# Patient Record
Sex: Male | Born: 1980 | Marital: Married | Smoking: Never smoker
Health system: Southern US, Community
[De-identification: ages and names within clinical notes are randomized; demographics above are authoritative.]

---

## 2020-12-22 ENCOUNTER — Emergency Department (HOSPITAL_COMMUNITY): Payer: Self-pay | Admitting: Certified Registered Nurse Anesthetist

## 2020-12-22 ENCOUNTER — Encounter (HOSPITAL_COMMUNITY): Payer: Self-pay

## 2020-12-22 ENCOUNTER — Encounter (HOSPITAL_COMMUNITY)
Admission: EM | Disposition: A | Discharge status: Home / Self Care | Payer: Self-pay | Source: Home / Self Care | Attending: Emergency Medicine

## 2020-12-22 ENCOUNTER — Emergency Department (HOSPITAL_COMMUNITY): Payer: Self-pay

## 2020-12-22 ENCOUNTER — Ambulatory Visit (HOSPITAL_COMMUNITY)
Admission: EM | Admit: 2020-12-22 | Discharge: 2020-12-23 | Disposition: A | Discharge status: Home / Self Care | Payer: Self-pay | Attending: Emergency Medicine | Admitting: Emergency Medicine

## 2020-12-22 ENCOUNTER — Other Ambulatory Visit: Payer: Self-pay

## 2020-12-22 DIAGNOSIS — Z20822 Contact with and (suspected) exposure to covid-19: Secondary | ICD-10-CM | POA: Insufficient documentation

## 2020-12-22 DIAGNOSIS — S41111A Laceration without foreign body of right upper arm, initial encounter: Secondary | ICD-10-CM | POA: Insufficient documentation

## 2020-12-22 DIAGNOSIS — Z23 Encounter for immunization: Secondary | ICD-10-CM | POA: Insufficient documentation

## 2020-12-22 DIAGNOSIS — W11XXXA Fall on and from ladder, initial encounter: Secondary | ICD-10-CM | POA: Insufficient documentation

## 2020-12-22 DIAGNOSIS — S46221A Laceration of muscle, fascia and tendon of other parts of biceps, right arm, initial encounter: Secondary | ICD-10-CM

## 2020-12-22 HISTORY — PX: I & D EXTREMITY: SHX5045

## 2020-12-22 LAB — BASIC METABOLIC PANEL
Anion gap: 17 — ABNORMAL HIGH (ref 5–15)
BUN: 17 mg/dL (ref 6–20)
CO2: 18 mmol/L — ABNORMAL LOW (ref 22–32)
Calcium: 9.4 mg/dL (ref 8.9–10.3)
Chloride: 100 mmol/L (ref 98–111)
Creatinine, Ser: 1.52 mg/dL — ABNORMAL HIGH (ref 0.61–1.24)
GFR, Estimated: 59 mL/min — ABNORMAL LOW (ref >60)
Glucose, Bld: 115 mg/dL — ABNORMAL HIGH (ref 70–99)
Potassium: 4.2 mmol/L (ref 3.5–5.1)
Sodium: 135 mmol/L (ref 135–145)

## 2020-12-22 LAB — CBC WITH DIFFERENTIAL/PLATELET
Abs Immature Granulocytes: 0.12 10*3/uL — ABNORMAL HIGH (ref 0.00–0.07)
Basophils Absolute: 0.1 10*3/uL (ref 0.0–0.1)
Basophils Relative: 1 %
Eosinophils Absolute: 0.1 10*3/uL (ref 0.0–0.5)
Eosinophils Relative: 0 %
HCT: 55 % — ABNORMAL HIGH (ref 39.0–52.0)
Hemoglobin: 18.6 g/dL — ABNORMAL HIGH (ref 13.0–17.0)
Immature Granulocytes: 1 %
Lymphocytes Relative: 12 %
Lymphs Abs: 1.8 10*3/uL (ref 0.7–4.0)
MCH: 31.5 pg (ref 26.0–34.0)
MCHC: 33.8 g/dL (ref 30.0–36.0)
MCV: 93.1 fL (ref 80.0–100.0)
Monocytes Absolute: 0.9 10*3/uL (ref 0.1–1.0)
Monocytes Relative: 6 %
Neutro Abs: 11.8 10*3/uL — ABNORMAL HIGH (ref 1.7–7.7)
Neutrophils Relative %: 80 %
Platelets: 322 10*3/uL (ref 150–400)
RBC: 5.91 MIL/uL — ABNORMAL HIGH (ref 4.22–5.81)
RDW: 13.5 % (ref 11.5–15.5)
WBC: 14.7 10*3/uL — ABNORMAL HIGH (ref 4.0–10.5)
nRBC: 0 % (ref 0.0–0.2)

## 2020-12-22 LAB — RESP PANEL BY RT-PCR (FLU A&B, COVID) ARPGX2
Influenza A by PCR: NEGATIVE
Influenza B by PCR: NEGATIVE
SARS Coronavirus 2 by RT PCR: NEGATIVE

## 2020-12-22 SURGERY — IRRIGATION AND DEBRIDEMENT EXTREMITY
Anesthesia: General | Site: Arm Upper | Laterality: Right

## 2020-12-22 MED ORDER — SUCCINYLCHOLINE CHLORIDE 200 MG/10ML IV SOSY
PREFILLED_SYRINGE | INTRAVENOUS | Status: AC
  Filled 2020-12-22: qty 10

## 2020-12-22 MED ORDER — FENTANYL CITRATE (PF) 100 MCG/2ML IJ SOLN
50.0000 ug | Freq: Once | INTRAMUSCULAR | Status: AC
Start: 2020-12-22 — End: 2020-12-22
  Administered 2020-12-22: 50 ug via INTRAVENOUS
  Filled 2020-12-22: qty 2

## 2020-12-22 MED ORDER — LIDOCAINE HCL (PF) 2 % IJ SOLN
INTRAMUSCULAR | Status: AC
  Filled 2020-12-22: qty 5

## 2020-12-22 MED ORDER — BUPIVACAINE-EPINEPHRINE (PF) 0.25% -1:200000 IJ SOLN
INTRAMUSCULAR | Status: AC
  Filled 2020-12-22: qty 30

## 2020-12-22 MED ORDER — SUGAMMADEX SODIUM 200 MG/2ML IV SOLN
INTRAVENOUS | Status: DC | PRN
  Administered 2020-12-22: 200 mg via INTRAVENOUS

## 2020-12-22 MED ORDER — DEXAMETHASONE SODIUM PHOSPHATE 10 MG/ML IJ SOLN
INTRAMUSCULAR | Status: DC | PRN
  Administered 2020-12-22: 10 mg via INTRAVENOUS

## 2020-12-22 MED ORDER — TETANUS-DIPHTH-ACELL PERTUSSIS 5-2.5-18.5 LF-MCG/0.5 IM SUSY
0.5000 mL | PREFILLED_SYRINGE | Freq: Once | INTRAMUSCULAR | Status: AC
  Administered 2020-12-22: 0.5 mL via INTRAMUSCULAR
  Filled 2020-12-22: qty 0.5

## 2020-12-22 MED ORDER — DEXAMETHASONE SODIUM PHOSPHATE 10 MG/ML IJ SOLN
INTRAMUSCULAR | Status: AC
  Filled 2020-12-22: qty 1

## 2020-12-22 MED ORDER — EPHEDRINE 5 MG/ML INJ
INTRAVENOUS | Status: AC
  Filled 2020-12-22: qty 10

## 2020-12-22 MED ORDER — HYDROMORPHONE HCL 1 MG/ML IJ SOLN: 0.2500 mg | INTRAMUSCULAR | Status: DC | PRN

## 2020-12-22 MED ORDER — ONDANSETRON HCL 4 MG/2ML IJ SOLN
4.0000 mg | Freq: Once | INTRAMUSCULAR | Status: AC
  Administered 2020-12-22: 4 mg via INTRAVENOUS
  Filled 2020-12-22: qty 2

## 2020-12-22 MED ORDER — 0.9 % SODIUM CHLORIDE (POUR BTL) OPTIME
TOPICAL | Status: DC | PRN
  Administered 2020-12-22: 6000 mL
  Administered 2020-12-22: 1000 mL

## 2020-12-22 MED ORDER — PROPOFOL 10 MG/ML IV BOLUS
INTRAVENOUS | Status: DC | PRN
  Administered 2020-12-22: 200 mg via INTRAVENOUS

## 2020-12-22 MED ORDER — SODIUM CHLORIDE 0.9 % IV BOLUS
1000.0000 mL | Freq: Once | INTRAVENOUS | Status: AC
  Administered 2020-12-22: 1000 mL via INTRAVENOUS

## 2020-12-22 MED ORDER — LACTATED RINGERS IV SOLN: INTRAVENOUS | Status: DC | PRN

## 2020-12-22 MED ORDER — PHENYLEPHRINE 40 MCG/ML (10ML) SYRINGE FOR IV PUSH (FOR BLOOD PRESSURE SUPPORT)
PREFILLED_SYRINGE | INTRAVENOUS | Status: AC
  Filled 2020-12-22: qty 10

## 2020-12-22 MED ORDER — PROPOFOL 10 MG/ML IV BOLUS
INTRAVENOUS | Status: AC
  Filled 2020-12-22: qty 20

## 2020-12-22 MED ORDER — VANCOMYCIN HCL 1000 MG IV SOLR: INTRAVENOUS | Status: DC | PRN

## 2020-12-22 MED ORDER — OXYCODONE-ACETAMINOPHEN 5-325 MG PO TABS: 1.0000 | ORAL_TABLET | ORAL | 0 refills | Status: AC | PRN

## 2020-12-22 MED ORDER — EPHEDRINE SULFATE-NACL 50-0.9 MG/10ML-% IV SOSY
PREFILLED_SYRINGE | INTRAVENOUS | Status: DC | PRN
  Administered 2020-12-22: 5 mg via INTRAVENOUS

## 2020-12-22 MED ORDER — CEFAZOLIN SODIUM-DEXTROSE 2-4 GM/100ML-% IV SOLN
2.0000 g | Freq: Once | INTRAVENOUS | Status: AC
  Administered 2020-12-22: 2 g via INTRAVENOUS
  Filled 2020-12-22: qty 100

## 2020-12-22 MED ORDER — PHENYLEPHRINE 40 MCG/ML (10ML) SYRINGE FOR IV PUSH (FOR BLOOD PRESSURE SUPPORT)
PREFILLED_SYRINGE | INTRAVENOUS | Status: DC | PRN
  Administered 2020-12-22: 120 ug via INTRAVENOUS
  Administered 2020-12-22: 160 ug via INTRAVENOUS
  Administered 2020-12-22: 120 ug via INTRAVENOUS

## 2020-12-22 MED ORDER — FENTANYL CITRATE (PF) 250 MCG/5ML IJ SOLN
INTRAMUSCULAR | Status: AC
  Filled 2020-12-22: qty 5

## 2020-12-22 MED ORDER — ROCURONIUM BROMIDE 10 MG/ML (PF) SYRINGE
PREFILLED_SYRINGE | INTRAVENOUS | Status: DC | PRN
  Administered 2020-12-22: 40 mg via INTRAVENOUS

## 2020-12-22 MED ORDER — LIDOCAINE 2% (20 MG/ML) 5 ML SYRINGE
INTRAMUSCULAR | Status: DC | PRN
  Administered 2020-12-22: 60 mg via INTRAVENOUS

## 2020-12-22 MED ORDER — SUCCINYLCHOLINE CHLORIDE 200 MG/10ML IV SOSY
PREFILLED_SYRINGE | INTRAVENOUS | Status: DC | PRN
  Administered 2020-12-22: 100 mg via INTRAVENOUS

## 2020-12-22 MED ORDER — ARTIFICIAL TEARS OPHTHALMIC OINT
TOPICAL_OINTMENT | OPHTHALMIC | Status: AC
  Filled 2020-12-22: qty 3.5

## 2020-12-22 MED ORDER — MIDAZOLAM HCL 2 MG/2ML IJ SOLN
INTRAMUSCULAR | Status: DC | PRN
  Administered 2020-12-22: 2 mg via INTRAVENOUS

## 2020-12-22 MED ORDER — ONDANSETRON HCL 4 MG/2ML IJ SOLN
INTRAMUSCULAR | Status: AC
  Filled 2020-12-22: qty 2

## 2020-12-22 MED ORDER — FENTANYL CITRATE (PF) 250 MCG/5ML IJ SOLN
INTRAMUSCULAR | Status: DC | PRN
  Administered 2020-12-22 (×2): 100 ug via INTRAVENOUS

## 2020-12-22 MED ORDER — ONDANSETRON HCL 4 MG/2ML IJ SOLN
INTRAMUSCULAR | Status: DC | PRN
  Administered 2020-12-22: 4 mg via INTRAVENOUS

## 2020-12-22 MED ORDER — ROCURONIUM BROMIDE 10 MG/ML (PF) SYRINGE
PREFILLED_SYRINGE | INTRAVENOUS | Status: AC
  Filled 2020-12-22: qty 10

## 2020-12-22 MED ORDER — VANCOMYCIN HCL 1000 MG IV SOLR
INTRAVENOUS | Status: AC
  Filled 2020-12-22: qty 1000

## 2020-12-22 MED ORDER — MIDAZOLAM HCL 2 MG/2ML IJ SOLN
INTRAMUSCULAR | Status: AC
  Filled 2020-12-22: qty 2

## 2020-12-22 MED ORDER — HYDROMORPHONE HCL 1 MG/ML IJ SOLN
1.0000 mg | Freq: Once | INTRAMUSCULAR | Status: AC
Start: 2020-12-22 — End: 2020-12-22
  Administered 2020-12-22: 1 mg via INTRAVENOUS
  Filled 2020-12-22: qty 1

## 2020-12-22 SURGICAL SUPPLY — 42 items
BNDG ELASTIC 3X5.8 VLCR STR LF (GAUZE/BANDAGES/DRESSINGS) ×2 IMPLANT
BNDG ELASTIC 4X5.8 VLCR STR LF (GAUZE/BANDAGES/DRESSINGS) ×2 IMPLANT
BNDG GAUZE ELAST 4 BULKY (GAUZE/BANDAGES/DRESSINGS) ×4 IMPLANT
BRUSH SCRUB EZ PLAIN DRY (MISCELLANEOUS) ×4 IMPLANT
COVER SURGICAL LIGHT HANDLE (MISCELLANEOUS) ×6 IMPLANT
DRAPE HALF SHEET 40X57 (DRAPES) ×2 IMPLANT
DRAPE SURG 17X23 STRL (DRAPES) ×3 IMPLANT
DRAPE U-SHAPE 47X51 STRL (DRAPES) ×3 IMPLANT
DRSG MEPITEL 4X7.2 (GAUZE/BANDAGES/DRESSINGS) ×2 IMPLANT
ELECT REM PT RETURN 9FT ADLT (ELECTROSURGICAL) ×3
ELECTRODE REM PT RTRN 9FT ADLT (ELECTROSURGICAL) IMPLANT
GAUZE SPONGE 4X4 12PLY STRL (GAUZE/BANDAGES/DRESSINGS) ×3 IMPLANT
GAUZE SPONGE 4X4 12PLY STRL LF (GAUZE/BANDAGES/DRESSINGS) ×2 IMPLANT
GLOVE BIO SURGEON STRL SZ 6.5 (GLOVE) ×6 IMPLANT
GLOVE BIO SURGEON STRL SZ7.5 (GLOVE) ×10 IMPLANT
GLOVE BIO SURGEONS STRL SZ 6.5 (GLOVE) ×3
GLOVE BIOGEL PI IND STRL 7.5 (GLOVE) ×1 IMPLANT
GLOVE BIOGEL PI INDICATOR 7.5 (GLOVE) ×2
GLOVE SURG UNDER POLY LF SZ6.5 (GLOVE) ×3 IMPLANT
GOWN STRL REUS W/ TWL LRG LVL3 (GOWN DISPOSABLE) ×2 IMPLANT
GOWN STRL REUS W/TWL LRG LVL3 (GOWN DISPOSABLE) ×6
KIT BASIN OR (CUSTOM PROCEDURE TRAY) ×3 IMPLANT
KIT TURNOVER KIT B (KITS) ×3 IMPLANT
MANIFOLD NEPTUNE II (INSTRUMENTS) ×3 IMPLANT
NS IRRIG 1000ML POUR BTL (IV SOLUTION) ×3 IMPLANT
PACK ORTHO EXTREMITY (CUSTOM PROCEDURE TRAY) ×3 IMPLANT
PAD ARMBOARD 7.5X6 YLW CONV (MISCELLANEOUS) ×6 IMPLANT
PAD CAST 4YDX4 CTTN HI CHSV (CAST SUPPLIES) IMPLANT
PADDING CAST COTTON 4X4 STRL (CAST SUPPLIES) ×3
SET CYSTO W/LG BORE CLAMP LF (SET/KITS/TRAYS/PACK) ×2 IMPLANT
SLING ARM IMMOBILIZER LRG (SOFTGOODS) ×2 IMPLANT
SOL PREP POV-IOD 4OZ 10% (MISCELLANEOUS) ×2 IMPLANT
SOL PREP PROV IODINE SCRUB 4OZ (MISCELLANEOUS) ×2 IMPLANT
SPONGE LAP 18X18 RF (DISPOSABLE) ×3 IMPLANT
SUT ETHILON 3 0 FSL (SUTURE) ×4 IMPLANT
SUT ETHILON 3 0 PS 1 (SUTURE) ×6 IMPLANT
TOWEL GREEN STERILE (TOWEL DISPOSABLE) ×4 IMPLANT
TOWEL GREEN STERILE FF (TOWEL DISPOSABLE) ×3 IMPLANT
TUBE CONNECTING 12'X1/4 (SUCTIONS) ×1
TUBE CONNECTING 12X1/4 (SUCTIONS) ×2 IMPLANT
UNDERPAD 30X36 HEAVY ABSORB (UNDERPADS AND DIAPERS) ×3 IMPLANT
YANKAUER SUCT BULB TIP NO VENT (SUCTIONS) ×3 IMPLANT

## 2020-12-22 NOTE — Op Note (Signed)
Orthopaedic Surgery Operative Note (CSN: [id number] ) Date of Surgery: 12/22/2020  Admit Date: 12/22/2020   Diagnoses: Pre-Op Diagnoses: Right upper arm laceration   Post-Op Diagnosis: Right upper arm laceration Right biceps muscle laceration  Procedures: 1. CPT 24341-Primary repair of biceps muscle belly/fascia 2. CPT 11043-Debridement of muscle and fascia (<20 sq cm) 3. CPT 12005-Repair laceration of right upper arm (13 cm in length)   Surgeons : Primary: Roby Lofts, MD  Assistant: None  Location:OR 7   Anesthesia:General   Antibiotics: Ancef 2g in ER   Tourniquet time:None  Estimated Blood Loss:5 mL  Complications:None  Specimens:None   Implants: * No implants in log *   Indications for Surgery: 40 year old male who fell injuring his arm had an paling injury a large laceration that extended into the biceps muscle.  Due to the significant nature of his injury I recommended proceeding to the operating room for irrigation debridement and repair as necessary.  Risks and benefits were discussed with the patient.  Risks include but not limited to bleeding, infection, nerve or blood vessel injury, elbow stiffness, arm weakness, possibility of inability to repair muscle or tendon, even the possibility anesthetic complications.  The patient agreed to proceed with surgery and consent was obtained.  Operative Findings: 1.  13 cm laceration to the upper arm that transverse the majority of the biceps muscle belly.  There was still some retained lateral portion of the long head of the biceps. 2.  Irrigation debridement of skin subcutaneous tissue, fascia and biceps muscle belly with out any gross contamination. 3.  Attempt at primary repair of biceps with suturing of the muscle belly as well as the overlying biceps fascia to approximate the muscle edges.  Procedure: The patient was identified in the preoperative holding area. Consent was confirmed with the patient and their  family and all questions were answered. The operative extremity was marked after confirmation with the patient. he was then brought back to the operating room by our anesthesia colleagues.  He was placed under general anesthetic and carefully transferred over to a regular OR table.  His right upper extremity was prepped and draped in usual sterile fashion.  A timeout was performed to verify the patient, the procedure, and the extremity.  He had already received preoperative antibiotics in the emergency room.  For started out by extending the laceration to expose the entirety of the wound and the biceps muscle belly and fascia.  The laceration had transected at the muscle belly.  I was able to palpate the median nerve and the brachial artery that were intact without any violation.  There is a portion of the biceps muscle that was still intact laterally approximately 25% of the muscle belly itself.  The brachialis was still intact below the laceration.  I used a knife to debride the skin edges the subcutaneous tissue, fascia and some of the muscle.  There is no gross contamination.  I then used 6 L of normal saline using cystoscopy tubing to irrigate the wound.  I then changed gloves and instruments.  I then used a 2-0 Monocryl to suture the muscle belly together.  I also brought the incised biceps fascia together.  This eliminated some of the deformity that was present preoperatively.  I continue to close the biceps fascia with a figure-of-eight sutures using the 2-0 Monocryl.  I then proceeded to run the 2-0 Monocryl to close the skin and subcutaneous fashion.  A 3-0 nylon was used to reinforce the  skin repair.  I then injected quarter percent Marcaine with epinephrine locally into the soft tissues to provide pain control.  A sterile dressing consisting of Mepitel, 4 x 4's, sterile cast padding and Ace wrap was applied.  A sling was applied to his upper extremity.  The patient was then awoken from anesthesia and  taken to the PACU in stable condition.   Debridement type: Excisional Debridement  Side: right  Body Location: Upper arm  Tools used for debridement: scalpel and scissors  Pre-debridement Wound size (cm):   Length: 13cm        Width: 4cm     Depth: 4cm   Post-debridement Wound size (cm):  N/A  Debridement depth beyond dead/damaged tissue down to healthy viable tissue: yes  Tissue layer involved: skin, subcutaneous tissue, muscle / fascia  Nature of tissue removed: Non-viable tissue  Irrigation volume: 6L     Irrigation fluid type: Normal Saline   Post Op Plan/Instructions: Patient will be nonweightbearing to the right upper extremity.  He will keep his dressing on for 48 hours.  Then he will remove the dressing.  He will allow gentle range of motion of the elbow as tolerated.  Return for suture removal and wound check in 7 to 10 days.  No DVT prophylaxis is needed in this healthy upper extremity patient.  I was present and performed the entire surgery.  Truitt Merle, MD [facility]

## 2020-12-22 NOTE — Discharge Instructions (Signed)
Orthopaedic Trauma Service Discharge Instructions   General Discharge Instructions  WEIGHT BEARING STATUS: Non-weightbearing right upper extremity  RANGE OF MOTION/ACTIVITY: Wear sling for next 1 week.  Wound Care: You may remove your surgical dressing on Saturday 12/25/20. Incisions can be left open to air if there is no drainage. If incision continues to have drainage, follow wound care instructions below. Okay to shower if no drainage from incisions.  DVT/PE prophylaxis: None  Diet: as you were eating previously.  Can use over the counter stool softeners and bowel preparations, such as Miralax, to help with bowel movements.  Narcotics can be constipating.  Be sure to drink plenty of fluids  PAIN MEDICATION USE AND EXPECTATIONS  You have likely been given narcotic medications to help control your pain.  After a traumatic event that results in an fracture (broken bone) with or without surgery, it is ok to use narcotic pain medications to help control one's pain.  We understand that everyone responds to pain differently and each individual patient will be evaluated on a regular basis for the continued need for narcotic medications.  As a patient it is your responsibility as well to monitor narcotic medication use and report the amount and frequency you use these medications when you come to your office visit.   We would also advise that if you are using narcotic medications, you should take a dose prior to therapy to maximize you participation.  IF YOU ARE ON NARCOTIC MEDICATIONS IT IS NOT PERMISSIBLE TO OPERATE A MOTOR VEHICLE (MOTORCYCLE/CAR/TRUCK/MOPED) OR HEAVY MACHINERY DO NOT MIX NARCOTICS WITH OTHER CNS (CENTRAL NERVOUS SYSTEM) DEPRESSANTS SUCH AS ALCOHOL   STOP SMOKING OR USING NICOTINE PRODUCTS!!!!  As discussed nicotine severely impairs your body's ability to heal surgical and traumatic wounds but also impairs bone healing.  Wounds and bone heal by forming microscopic blood  vessels (angiogenesis) and nicotine is a vasoconstrictor (essentially, shrinks blood vessels).  Therefore, if vasoconstriction occurs to these microscopic blood vessels they essentially disappear and are unable to deliver necessary nutrients to the healing tissue.  This is one modifiable factor that you can do to dramatically increase your chances of healing your injury.    (This means no smoking, no nicotine gum, patches, etc)  DO NOT USE NONSTEROIDAL ANTI-INFLAMMATORY DRUGS (NSAID'S)  Using products such as Advil (ibuprofen), Aleve (naproxen), Motrin (ibuprofen) for additional pain control during fracture healing can delay and/or prevent the healing response.  If you would like to take over the counter (OTC) medication, Tylenol (acetaminophen) is ok.  However, some narcotic medications that are given for pain control contain acetaminophen as well. Therefore, you should not exceed more than 4000 mg of tylenol in a day if you do not have liver disease.  Also note that there are may OTC medicines, such as cold medicines and allergy medicines that my contain tylenol as well.  If you have any questions about medications and/or interactions please ask your doctor/PA or your pharmacist.      ICE AND ELEVATE INJURED/OPERATIVE EXTREMITY  Using ice and elevating the injured extremity above your heart can help with swelling and pain control.  Icing in a pulsatile fashion, such as 20 minutes on and 20 minutes off, can be followed.    Do not place ice directly on skin. Make sure there is a barrier between to skin and the ice pack.    Using frozen items such as frozen peas works well as the conform nicely to the are that needs to  be iced.  USE AN ACE WRAP OR TED HOSE FOR SWELLING CONTROL  In addition to icing and elevation, Ace wraps or TED hose are used to help limit and resolve swelling.  It is recommended to use Ace wraps or TED hose until you are informed to stop.    When using Ace Wraps start the wrapping  distally (farthest away from the body) and wrap proximally (closer to the body)   Example: If you had surgery on your leg or thing and you do not have a splint on, start the ace wrap at the toes and work your way up to the thigh        If you had surgery on your upper extremity and do not have a splint on, start the ace wrap at your fingers and work your way up to the upper arm   CALL THE OFFICE WITH ANY QUESTIONS/CONCERNS OR FOR MEDICATION REFILLS: [phone]   VISIT OUR WEBSITE FOR ADDITIONAL INFORMATION: [email or web address]     Discharge Wound Care Instructions  Do NOT apply any ointments, solutions or lotions to pin sites or surgical wounds.  These prevent needed drainage and even though solutions like hydrogen peroxide kill bacteria, they also damage cells lining the pin sites that help fight infection.  Applying lotions or ointments can keep the wounds moist and can cause them to breakdown and open up as well. This can increase the risk for infection. When in doubt call the office.  If any drainage is noted, use one layer of adaptic, then gauze, Kerlix, and an ace wrap.  Once the incision is completely dry and without drainage, it may be left open to air out.  Showering may begin 36-48 hours later.  Cleaning gently with soap and water.  Traumatic wounds should be dressed daily as well.    One layer of adaptic, gauze, Kerlix, then ace wrap.  The adaptic can be discontinued once the draining has ceased    If you have a wet to dry dressing: wet the gauze with saline the squeeze as much saline out so the gauze is moist (not soaking wet), place moistened gauze over wound, then place a dry gauze over the moist one, followed by Kerlix wrap, then ace wrap.

## 2020-12-22 NOTE — ED Notes (Signed)
Pt stated his arm was bleeding more. I examined the area I noticed a slow bleed. I will notify provider.

## 2020-12-22 NOTE — ED Notes (Signed)
Pt speaking to cousin on the phone. Pt requested to add her to his chart to discuss care. Pt care team added her.

## 2020-12-22 NOTE — Anesthesia Procedure Notes (Signed)
Procedure Name: Intubation Date/Time: 12/22/2020 9:31 PM Performed by: Genevie Ann, CRNA Pre-anesthesia Checklist: Patient identified, Emergency Drugs available, Suction available and Patient being monitored Patient Re-evaluated:Patient Re-evaluated prior to induction Oxygen Delivery Method: Circle System Utilized Preoxygenation: Pre-oxygenation with 100% oxygen Induction Type: IV induction Ventilation: Mask ventilation without difficulty Laryngoscope Size: Glidescope and 4 Grade View: Grade I Tube type: Oral Tube size: 7.5 mm Number of attempts: 3 Airway Equipment and Method: Stylet and Oral airway Placement Confirmation: ETT inserted through vocal cords under direct vision,  positive ETCO2 and breath sounds checked- equal and bilateral Secured at: 21 cm Tube secured with: Tape Dental Injury: Teeth and Oropharynx as per pre-operative assessment  Difficulty Due To: Difficulty was unanticipated Comments: 3 combined DVL attempts by CRNA and MD with Mac 4 & Miller 3, 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

## 2020-12-22 NOTE — ED Notes (Signed)
Pt pocket knife was labeled with pt stickers and provided to security. OR notified pt pocket knife is with security.

## 2020-12-22 NOTE — Anesthesia Postprocedure Evaluation (Signed)
Anesthesia Post Note  Patient: Antoine Primas  Procedure(s) Performed: IRRIGATION AND DEBRIDEMENT RIGHT UPPER ARM (Right Arm Upper)     Patient location during evaluation: PACU Anesthesia Type: General Level of consciousness: awake and alert Pain management: pain level controlled Vital Signs Assessment: post-procedure vital signs reviewed and stable Respiratory status: spontaneous breathing, nonlabored ventilation and respiratory function stable Cardiovascular status: blood pressure returned to baseline and stable Postop Assessment: no apparent nausea or vomiting Anesthetic complications: no   No complications documented.  Last Vitals:  Vitals:   12/22/20 2315 12/22/20 2330  BP: (!) 144/97 (!) 144/93  Pulse: 91 92  Resp: (!) 21 13  Temp:  36.8 C  SpO2: 95% 98%    Last Pain:  Vitals:   12/22/20 2245  TempSrc:   PainSc: 0-No pain                 Marrianne Sica,W. EDMOND

## 2020-12-22 NOTE — H&P (Signed)
[facility] (OTS) Consult   Patient ID: Jimmy Gilmore MRN: [id number] DOB/AGE: Aug 25, 1980 40 y.o.  Reason for Consult:Right arm laceration Referring Physician: Dr. Norman Clay, MD [facility]  HPI: Jimmy Gilmore is an 40 y.o. male who is being seen in consultation at the request of Dr. Audley Hose for evaluation of right upper arm laceration.  The patient was on a ladder got twisted up had his arm impaled as he came down had a large laceration with significant involvement of the biceps tendon he states.  Had a significant bleeding presented to the emergency room where x-rays showed no acute fracture.  Orthopedics was consulted for evaluation and treatment.  Patient was seen and evaluated.  He is left-hand dominant.  Denies any tobacco use.  Lives at home with his cousin.  Works as a Administrator and other physical activity.  He denies any numbness or tingling in his upper extremity.  Denies any other injuries.  History reviewed. No pertinent past medical history.  History reviewed. No pertinent surgical history.  No family history on file.  Social History:  reports that he has never smoked. He has never used smokeless tobacco. No history on file for alcohol use and drug use.  Allergies: Not on File  Medications:  No current facility-administered medications on file prior to encounter.   No current outpatient medications on file prior to encounter.    ROS: Constitutional: No fever or chills Vision: No changes in vision ENT: No difficulty swallowing CV: No chest pain Pulm: No SOB or wheezing GI: No nausea or vomiting GU: No urgency or inability to hold urine Skin: No poor wound healing Neurologic: No numbness or tingling Psychiatric: No depression or anxiety Heme: No bruising Allergic: No reaction to medications or food   Exam: Blood pressure (!) 135/105, pulse 88, temperature 98.9 F (37.2 C), temperature source Oral, resp. rate (!) 22, height 5\' 6"  (1.676 m), weight  74.8 kg, SpO2 100 %. General: No acute distress Orientation: Awake alert and oriented x3 Mood and Affect: Cooperative and pleasant Gait: Within normal limits Coordination and balance: Within normal limits  Right upper extremity: Dressing is in place.  I did not take this down as I visualized the wounds from the previous pictures that were taken by the emergency room providers.  The compartments are soft compressible.  He does appear to have a slight deformity to his upper arm in the biceps region.  He has intact motor and sensory function to median, radial and ulnar nerve distribution.  2+ radial pulses.  He has brisk cap refill of less than 2 seconds.  He has no pain with wrist or finger range of motion.  Does have pain with attempted elbow range of motion.  Left upper extremity skin without lesions. No tenderness to palpation. Full painless ROM, full strength in each muscle groups without evidence of instability.   Medical Decision Making: Data: Imaging: X-rays of the right arm are reviewed which shows no acute fracture dislocation  Labs:  Results for orders placed or performed during the hospital encounter of 12/22/20 (from the past 24 hour(s))  Basic metabolic panel     Status: Abnormal   Collection Time: 12/22/20  6:12 PM  Result Value Ref Range   Sodium 135 135 - 145 mmol/L   Potassium 4.2 3.5 - 5.1 mmol/L   Chloride 100 98 - 111 mmol/L   CO2 18 (L) 22 - 32 mmol/L   Glucose, Bld 115 (H) 70 - 99  mg/dL   BUN 17 6 - 20 mg/dL   Creatinine, Ser 3.97 (H) 0.61 - 1.24 mg/dL   Calcium 9.4 8.9 - 67.3 mg/dL   GFR, Estimated 59 (L) >60 mL/min   Anion gap 17 (H) 5 - 15  CBC with Differential     Status: Abnormal   Collection Time: 12/22/20  6:12 PM  Result Value Ref Range   WBC 14.7 (H) 4.0 - 10.5 K/uL   RBC 5.91 (H) 4.22 - 5.81 MIL/uL   Hemoglobin 18.6 (H) 13.0 - 17.0 g/dL   HCT 41.9 (H) 37.9 - 02.4 %   MCV 93.1 80.0 - 100.0 fL   MCH 31.5 26.0 - 34.0 pg   MCHC 33.8 30.0 - 36.0 g/dL    RDW 09.7 35.3 - 29.9 %   Platelets 322 150 - 400 K/uL   nRBC 0.0 0.0 - 0.2 %   Neutrophils Relative % 80 %   Neutro Abs 11.8 (H) 1.7 - 7.7 K/uL   Lymphocytes Relative 12 %   Lymphs Abs 1.8 0.7 - 4.0 K/uL   Monocytes Relative 6 %   Monocytes Absolute 0.9 0.1 - 1.0 K/uL   Eosinophils Relative 0 %   Eosinophils Absolute 0.1 0.0 - 0.5 K/uL   Basophils Relative 1 %   Basophils Absolute 0.1 0.0 - 0.1 K/uL   Immature Granulocytes 1 %   Abs Immature Granulocytes 0.12 (H) 0.00 - 0.07 K/uL  Resp Panel by RT-PCR (Flu A&B, Covid) Nasopharyngeal Swab     Status: None   Collection Time: 12/22/20  6:12 PM   Specimen: Nasopharyngeal Swab; Nasopharyngeal(NP) swabs in vial transport medium  Result Value Ref Range   SARS Coronavirus 2 by RT PCR NEGATIVE NEGATIVE   Influenza A by PCR NEGATIVE NEGATIVE   Influenza B by PCR NEGATIVE NEGATIVE    Imaging or Labs ordered: None  Medical history and chart was reviewed and case discussed with medical provider.  Assessment/Plan: 40 year old male with a right upper arm laceration with likely biceps injury.  Due to the extent of his wound I recommend proceeding to the operating room urgently for irrigation debridement with possible repair versus possible closure.  The risks and benefits were discussed with the patient.  I discussed with him the possibility of inability to repair the biceps.  Dysfunction into rooms of elbow flexion.  I also discussed with him the possibility of nerve or blood vessel injury.  The patient agrees to proceed with surgery and consent was obtained.  We will likely have the patient discharged postoperatively.  Roby Lofts, MD [facility] [phone] (office) [email or web address]

## 2020-12-22 NOTE — ED Triage Notes (Signed)
Pt fell from a 40ft ladder. The fall was broken by a boat that was near the ladder. The pt hit his arm on the edge of the boat. There is approximately a 3.5" laceration on pt side bicep. The bleeding is controlled. The pt has a 5" bruise on right side of back. Pt states 0/10 pain for back and 5/10 pain for arm. Pt was given a total of 150 mcg fentanyl via ems. Pt stated he had a 12 pack of beer, a fireball liquor, and smoked marijuana today.

## 2020-12-22 NOTE — Transfer of Care (Signed)
Immediate Anesthesia Transfer of Care Note  Patient: Jimmy Gilmore  Procedure(s) Performed: IRRIGATION AND DEBRIDEMENT RIGHT UPPER ARM (Right Arm Upper)  Patient Location: PACU  Anesthesia Type:General  Level of Consciousness: awake, alert  and oriented  Airway & Oxygen Therapy: Patient Spontanous Breathing and Patient connected to face mask oxygen  Post-op Assessment: Report given to RN and Post -op Vital signs reviewed and stable  Post vital signs: Reviewed and stable  Last Vitals:  Vitals Value Taken Time  BP 131/91 12/22/20 2235  Temp 36.1 C 12/22/20 2235  Pulse 98 12/22/20 2238  Resp 18 12/22/20 2238  SpO2 100 % 12/22/20 2238  Vitals shown include unvalidated device data.  Last Pain:  Vitals:   12/22/20 1925  TempSrc:   PainSc: 6          Complications: No complications documented.

## 2020-12-22 NOTE — Anesthesia Preprocedure Evaluation (Addendum)
Anesthesia Evaluation  Patient identified by MRN, date of birth, ID band Patient awake    Reviewed: Allergy & Precautions, H&P , NPO status , Patient's Chart, lab work & pertinent test results  Airway Mallampati: III  TM Distance: >3 FB Neck ROM: Full    Dental no notable dental hx. (+) Teeth Intact, Dental Advisory Given   Pulmonary neg pulmonary ROS,    Pulmonary exam normal breath sounds clear to auscultation       Cardiovascular negative cardio ROS   Rhythm:Regular Rate:Normal     Neuro/Psych negative neurological ROS  negative psych ROS   GI/Hepatic negative GI ROS, Neg liver ROS,   Endo/Other  negative endocrine ROS  Renal/GU negative Renal ROS  negative genitourinary   Musculoskeletal   Abdominal   Peds  Hematology negative hematology ROS (+)   Anesthesia Other Findings   Reproductive/Obstetrics negative OB ROS                            Anesthesia Physical Anesthesia Plan  ASA: I and emergent  Anesthesia Plan: General   Post-op Pain Management:    Induction: Intravenous, Rapid sequence and Cricoid pressure planned  PONV Risk Score and Plan: 3 and Ondansetron, Dexamethasone and Midazolam  Airway Management Planned: Oral ETT  Additional Equipment:   Intra-op Plan:   Post-operative Plan: Extubation in OR  Informed Consent: I have reviewed the patients History and Physical, chart, labs and discussed the procedure including the risks, benefits and alternatives for the proposed anesthesia with the patient or authorized representative who has indicated his/her understanding and acceptance.     Dental advisory given  Plan Discussed with: CRNA  Anesthesia Plan Comments:         Anesthesia Quick Evaluation  

## 2020-12-22 NOTE — ED Provider Notes (Signed)
[facility] EMERGENCY DEPARTMENT Provider Note   CSN: [id number] Arrival date & time: 12/22/20  1719     History Chief Complaint  Patient presents with  . Fall    Jimmy Gilmore is a 40 y.o. male without significant past medical history, presenting to the emergency department via EMS for laceration to right biceps region.  Patient states he was standing on a ladder trying to fix a boat that was on a trailer.  He was about 6 feet high off the ground when the ladder gave out from under him and he fell catching the medial aspect of his right upper arm on a door latch.  This lacerated his arm.  He has normal distal sensation.  He did not hit his head or lose consciousness.  Not on blood thinners.  He is left-hand dominant.  EMS administered a total of 150 mcg of fentanyl in route.  He does endorse alcohol on board, throughout the day has had about 12 beers with some fireball and marijuana.   The history is provided by the patient and the EMS personnel.       History reviewed. No pertinent past medical history.  There are no problems to display for this patient.   History reviewed. No pertinent surgical history.     No family history on file.  Social History   Tobacco Use  . Smoking status: Never Smoker  . Smokeless tobacco: Never Used    Home Medications Prior to Admission medications   Not on File    Allergies    Patient has no allergy information on record.  Review of Systems   Review of Systems  Musculoskeletal: Positive for myalgias.  Neurological: Negative for syncope and numbness.  Hematological: Does not bruise/bleed easily.  All other systems reviewed and are negative.   Physical Exam Updated Vital Signs BP 121/85 (BP Location: Left Arm)   Pulse 93   Temp 98.9 F (37.2 C) (Oral)   Resp 15   Ht 5\' 6"  (1.676 m)   Wt 74.8 kg   SpO2 94%   BMI 26.63 kg/m   Physical Exam Vitals and nursing note reviewed.  Constitutional:       Appearance: He is well-developed.  HENT:     Head: Normocephalic and atraumatic.  Eyes:     Conjunctiva/sclera: Conjunctivae normal.  Cardiovascular:     Rate and Rhythm: Normal rate and regular rhythm.     Comments: Strong right radial pulse Pulmonary:     Effort: Pulmonary effort is normal. No respiratory distress.     Breath sounds: Normal breath sounds.     Comments: Superficial abrasion to the right posterior inferior ribs.  No crepitance or obvious deformity.  No large tenderness. Abdominal:     General: Bowel sounds are normal.     Palpations: Abdomen is soft.     Tenderness: There is no abdominal tenderness. There is no guarding or rebound.  Musculoskeletal:     Cervical back: Normal range of motion and neck supple. No tenderness.     Comments: Right anteromedial mid upper arm with deep laceration and large defect of the biceps muscle it does not appear grossly contaminated.  Laceration appears to transect majority of the biceps muscle. No arterial bleeding  Skin:    General: Skin is warm.  Neurological:     Mental Status: He is alert.     Comments: Normal sensation to light touch of the right distal upper extremity Patient is alert  and mentating appropriately.  Psychiatric:        Behavior: Behavior normal.     ED Results / Procedures / Treatments   Labs (all labs ordered are listed, but only abnormal results are displayed) Labs Reviewed  RESP PANEL BY RT-PCR (FLU A&B, COVID) ARPGX2  BASIC METABOLIC PANEL  CBC WITH DIFFERENTIAL/PLATELET    EKG None  Radiology DG Chest Port 1 View  Result Date: 12/22/2020 CLINICAL DATA:  Right chest wall trauma EXAM: PORTABLE CHEST 1 VIEW COMPARISON:  12/22/2020 FINDINGS: Heart and mediastinal contours are within normal limits. No focal opacities or effusions. No acute bony abnormality. No pneumothorax IMPRESSION: No active disease. Electronically Signed   By: Charlett Nose M.D.   On: 12/22/2020 18:32   DG Humerus Right  Result  Date: 12/22/2020 CLINICAL DATA:  Laceration.  Fall from ladder. EXAM: RIGHT HUMERUS - 2+ VIEW COMPARISON:  None. FINDINGS: Large soft tissue laceration noted in the medial upper right arm. No radiopaque foreign bodies. No acute bony abnormality. Specifically, no fracture, subluxation, or dislocation. IMPRESSION: No fracture or foreign body. Electronically Signed   By: Charlett Nose M.D.   On: 12/22/2020 18:08    Procedures Procedures   Medications Ordered in ED Medications  HYDROmorphone (DILAUDID) injection 1 mg (1 mg Intravenous Given 12/22/20 1731)  ondansetron (ZOFRAN) injection 4 mg (4 mg Intravenous Given 12/22/20 1731)  ceFAZolin (ANCEF) IVPB 2g/100 mL premix (0 g Intravenous Stopped 12/22/20 1903)  Tdap (BOOSTRIX) injection 0.5 mL (0.5 mLs Intramuscular Given 12/22/20 1825)  ondansetron (ZOFRAN) injection 4 mg (4 mg Intravenous Given 12/22/20 1855)  sodium chloride 0.9 % bolus 1,000 mL (1,000 mLs Intravenous New Bag/Given 12/22/20 1901)  fentaNYL (SUBLIMAZE) injection 50 mcg (50 mcg Intravenous Given 12/22/20 1854)    ED Course  I have reviewed the triage vital signs and the nursing notes.  Pertinent labs & imaging results that were available during my care of the patient were reviewed by me and considered in my medical decision making (see chart for details).  Clinical Course as of 12/22/20 1915  Wed Dec 22, 2020  2831 Discussed with orthopedist Dr Jena Gauss. He reviewed images and will take to OR for washout and repair.  [JR]  1840 Patient is in agreement with care plan.  On reevaluation he is diaphoretic and feels nauseated.  States that the Dilaudid caused him nausea and he is still in extreme pain.  Will redose antiemetic, switch back to fentanyl as he tolerated that better, administer some IV fluids.  Head of the bed was laid down. [JR]    Clinical Course User Index [JR] Paullette Mckain, [place] N, PA-C   MDM Rules/Calculators/A&P                          Patient with deep laceration to the right  upper arm which appears to transect majority of the mid biceps muscle.  He has strong radial pulse and distal sensation.  There is no brisk bleeding.  No head trauma.  Has contusion/abrasion to the right posterior ribs without tenderness or deformity.  No shortness of breath, lungs are clear.  Chest x-ray is negative.  Does have alcohol on board though is mentating appropriately.   He is given ancef, tdap, pain and nausea medication.  Consulted with orthopedist Dr. Jena Gauss who reviewed imaging, and will bring to the OR for washout and repair.  Pain was treated with Dilaudid and Zofran, however this still caused him to feel nauseated  and he continues to complain of extreme pain with lightheadedness.  We will administer a liter of fluids pending patient transport to the OR and switch back to fentanyl.   Final Clinical Impression(s) / ED Diagnoses Final diagnoses:  Laceration of right biceps brachii muscle    Rx / DC Orders ED Discharge Orders    None       Amarise Lillo, [place] N, PA-C 12/22/20 1915    Cheryll Cockayne, MD 12/22/20 1927

## 2020-12-22 NOTE — ED Notes (Signed)
Xray at the bedside.

## 2020-12-22 NOTE — ED Notes (Signed)
Pt stated he is still in pain. I notified the provider.

## 2020-12-23 ENCOUNTER — Encounter (HOSPITAL_COMMUNITY): Payer: Self-pay | Admitting: Student

## 2022-11-18 IMAGING — DX DG CHEST 1V PORT
2 series · 2 of 2 positions shown · non-contrast
Comparison: 12/22/2020

CLINICAL DATA: Right chest wall trauma

EXAM:
PORTABLE CHEST 1 VIEW

[chest ap (1 of 2)]
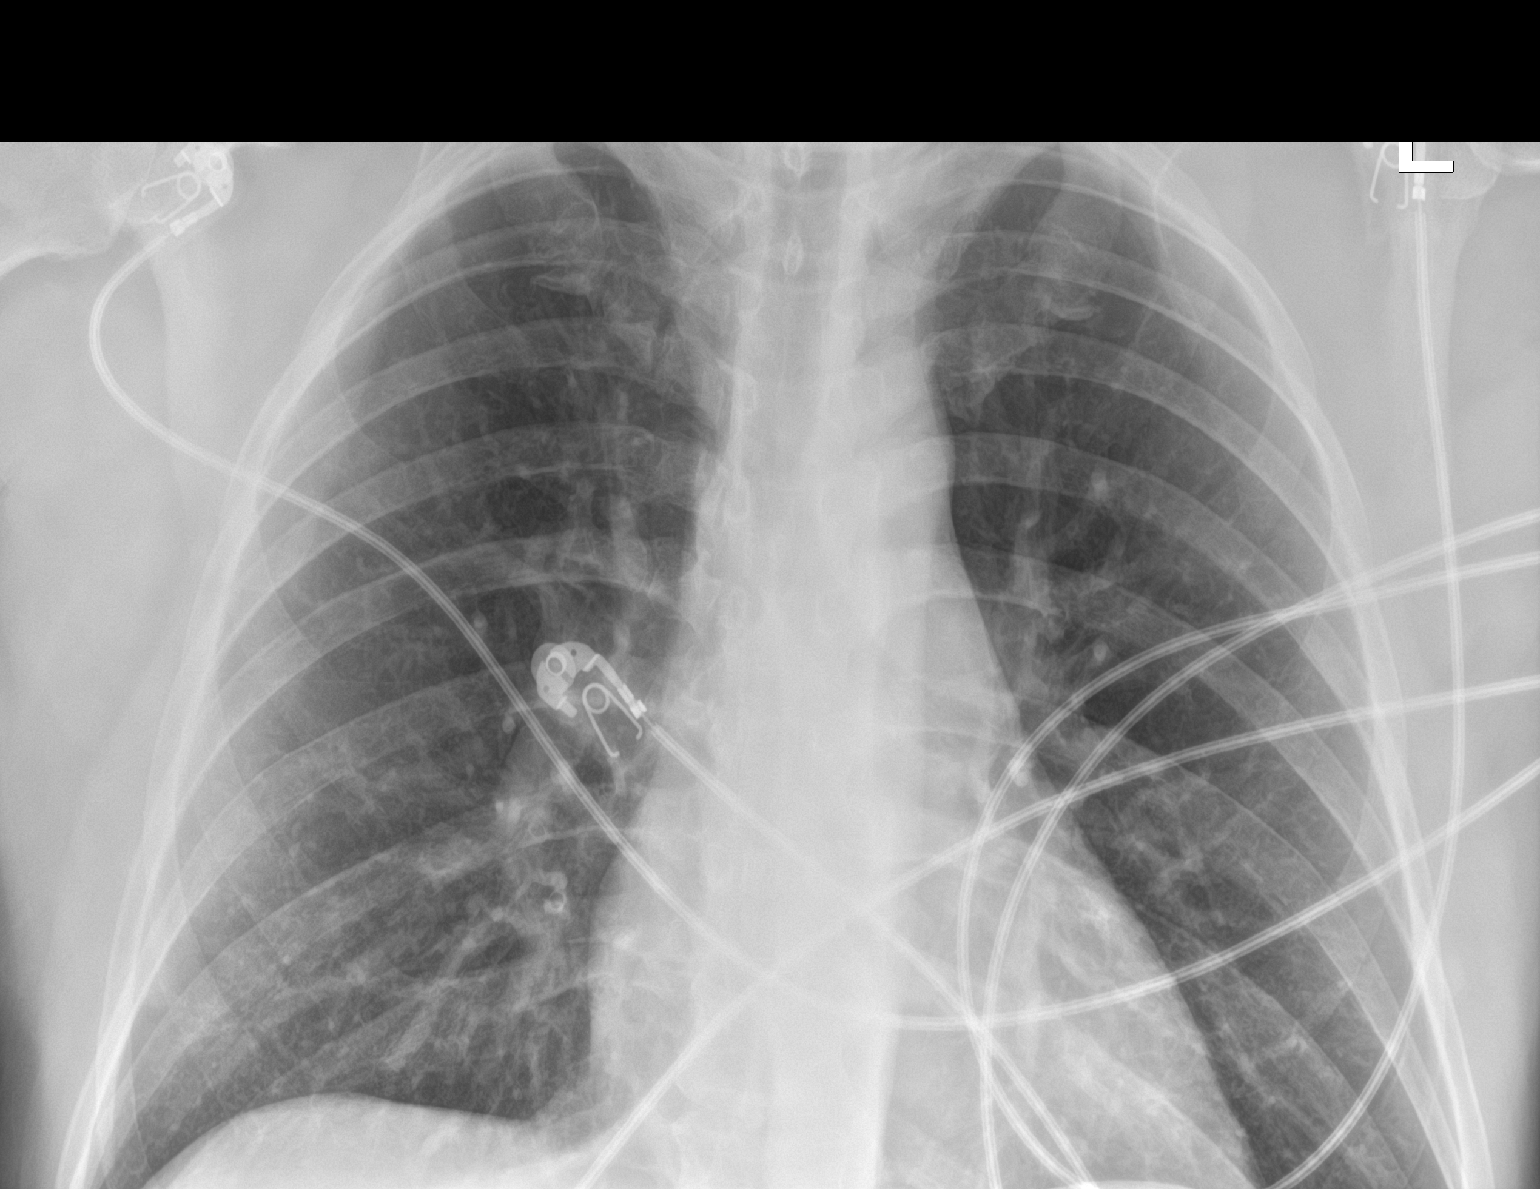

[chest ap (2 of 2)]
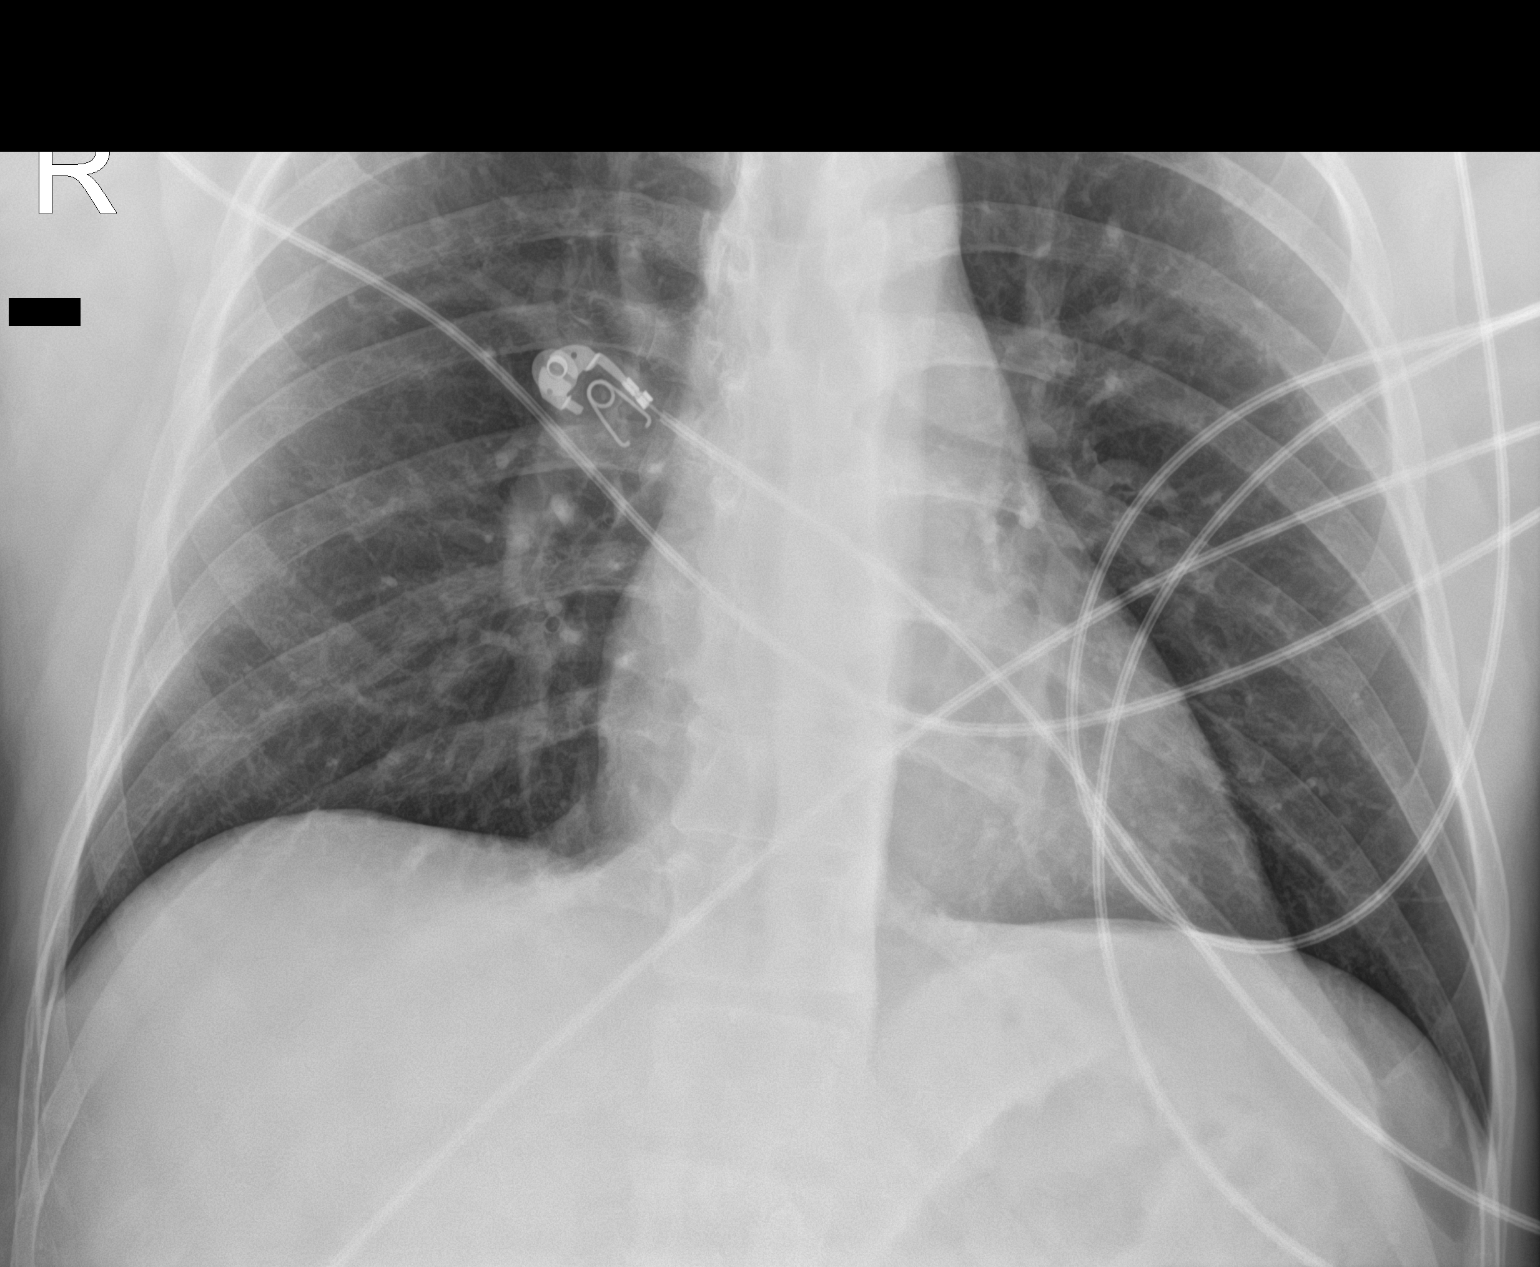

[2 of 2 positions shown; findings below may reference images not displayed]

FINDINGS: Heart and mediastinal contours are within normal limits. No focal
opacities or effusions. No acute bony abnormality. No pneumothorax
IMPRESSION: No active disease.
